# Patient Record
Sex: Female | Born: 1970 | Race: White | Hispanic: No | Marital: Married | Smoking: Never smoker
Health system: Southern US, Community
[De-identification: ages and names within clinical notes are randomized; demographics above are authoritative.]

## PROBLEM LIST (undated history)

## (undated) DIAGNOSIS — E669 Obesity, unspecified: Secondary | ICD-10-CM

## (undated) DIAGNOSIS — D649 Anemia, unspecified: Secondary | ICD-10-CM

## (undated) DIAGNOSIS — L4050 Arthropathic psoriasis, unspecified: Secondary | ICD-10-CM

## (undated) DIAGNOSIS — R519 Headache, unspecified: Secondary | ICD-10-CM

## (undated) HISTORY — PX: NO PAST SURGERIES: SHX2092

---

## 2005-07-18 ENCOUNTER — Inpatient Hospital Stay: Payer: Self-pay

## 2008-08-06 ENCOUNTER — Ambulatory Visit: Payer: Self-pay

## 2011-10-20 ENCOUNTER — Ambulatory Visit: Payer: Self-pay | Admitting: Internal Medicine

## 2013-09-26 ENCOUNTER — Ambulatory Visit: Payer: Self-pay | Admitting: Internal Medicine

## 2014-10-02 ENCOUNTER — Ambulatory Visit: Payer: Self-pay | Admitting: Internal Medicine

## 2015-08-24 ENCOUNTER — Other Ambulatory Visit: Payer: Self-pay | Admitting: Internal Medicine

## 2015-08-24 DIAGNOSIS — Z1231 Encounter for screening mammogram for malignant neoplasm of breast: Secondary | ICD-10-CM

## 2015-10-05 ENCOUNTER — Ambulatory Visit
Admission: RE | Admit: 2015-10-05 | Discharge: 2015-10-05 | Disposition: A | Discharge status: Home / Self Care | Payer: 59 | Source: Ambulatory Visit | Attending: Internal Medicine | Admitting: Internal Medicine

## 2015-10-05 DIAGNOSIS — Z1231 Encounter for screening mammogram for malignant neoplasm of breast: Secondary | ICD-10-CM

## 2016-12-23 ENCOUNTER — Other Ambulatory Visit: Payer: Self-pay | Admitting: Internal Medicine

## 2016-12-23 DIAGNOSIS — Z1231 Encounter for screening mammogram for malignant neoplasm of breast: Secondary | ICD-10-CM

## 2017-01-12 DIAGNOSIS — L4059 Other psoriatic arthropathy: Secondary | ICD-10-CM | POA: Diagnosis not present

## 2017-01-12 DIAGNOSIS — L4050 Arthropathic psoriasis, unspecified: Secondary | ICD-10-CM | POA: Diagnosis not present

## 2017-01-23 ENCOUNTER — Encounter (HOSPITAL_COMMUNITY): Payer: Self-pay

## 2017-01-23 ENCOUNTER — Ambulatory Visit
Admission: RE | Admit: 2017-01-23 | Discharge: 2017-01-23 | Disposition: A | Discharge status: Home / Self Care | Payer: 59 | Source: Ambulatory Visit | Attending: Internal Medicine | Admitting: Internal Medicine

## 2017-01-23 DIAGNOSIS — Z1231 Encounter for screening mammogram for malignant neoplasm of breast: Secondary | ICD-10-CM

## 2017-03-15 DIAGNOSIS — Z0000 Encounter for general adult medical examination without abnormal findings: Secondary | ICD-10-CM | POA: Diagnosis not present

## 2017-03-22 DIAGNOSIS — R739 Hyperglycemia, unspecified: Secondary | ICD-10-CM | POA: Diagnosis not present

## 2017-03-22 DIAGNOSIS — D510 Vitamin B12 deficiency anemia due to intrinsic factor deficiency: Secondary | ICD-10-CM | POA: Diagnosis not present

## 2017-03-22 DIAGNOSIS — Z79899 Other long term (current) drug therapy: Secondary | ICD-10-CM | POA: Diagnosis not present

## 2017-04-05 DIAGNOSIS — E538 Deficiency of other specified B group vitamins: Secondary | ICD-10-CM | POA: Diagnosis not present

## 2017-04-20 DIAGNOSIS — E538 Deficiency of other specified B group vitamins: Secondary | ICD-10-CM | POA: Diagnosis not present

## 2017-05-22 DIAGNOSIS — D510 Vitamin B12 deficiency anemia due to intrinsic factor deficiency: Secondary | ICD-10-CM | POA: Diagnosis not present

## 2017-06-26 DIAGNOSIS — L4050 Arthropathic psoriasis, unspecified: Secondary | ICD-10-CM | POA: Diagnosis not present

## 2017-06-27 DIAGNOSIS — L4050 Arthropathic psoriasis, unspecified: Secondary | ICD-10-CM | POA: Diagnosis not present

## 2017-07-10 DIAGNOSIS — H2511 Age-related nuclear cataract, right eye: Secondary | ICD-10-CM | POA: Diagnosis not present

## 2017-07-24 DIAGNOSIS — D510 Vitamin B12 deficiency anemia due to intrinsic factor deficiency: Secondary | ICD-10-CM | POA: Diagnosis not present

## 2017-09-15 DIAGNOSIS — R739 Hyperglycemia, unspecified: Secondary | ICD-10-CM | POA: Diagnosis not present

## 2017-09-15 DIAGNOSIS — Z79899 Other long term (current) drug therapy: Secondary | ICD-10-CM | POA: Diagnosis not present

## 2017-09-15 DIAGNOSIS — D510 Vitamin B12 deficiency anemia due to intrinsic factor deficiency: Secondary | ICD-10-CM | POA: Diagnosis not present

## 2017-09-22 DIAGNOSIS — Z0000 Encounter for general adult medical examination without abnormal findings: Secondary | ICD-10-CM | POA: Diagnosis not present

## 2017-09-22 DIAGNOSIS — D510 Vitamin B12 deficiency anemia due to intrinsic factor deficiency: Secondary | ICD-10-CM | POA: Diagnosis not present

## 2017-09-27 DIAGNOSIS — L4050 Arthropathic psoriasis, unspecified: Secondary | ICD-10-CM | POA: Diagnosis not present

## 2017-10-12 DIAGNOSIS — L4050 Arthropathic psoriasis, unspecified: Secondary | ICD-10-CM | POA: Diagnosis not present

## 2018-02-01 DIAGNOSIS — L4050 Arthropathic psoriasis, unspecified: Secondary | ICD-10-CM | POA: Diagnosis not present

## 2018-02-01 DIAGNOSIS — L4059 Other psoriatic arthropathy: Secondary | ICD-10-CM | POA: Diagnosis not present

## 2018-03-29 ENCOUNTER — Other Ambulatory Visit: Payer: Self-pay | Admitting: Internal Medicine

## 2018-03-29 DIAGNOSIS — Z1231 Encounter for screening mammogram for malignant neoplasm of breast: Secondary | ICD-10-CM

## 2018-04-05 DIAGNOSIS — D510 Vitamin B12 deficiency anemia due to intrinsic factor deficiency: Secondary | ICD-10-CM | POA: Diagnosis not present

## 2018-04-05 DIAGNOSIS — Z0000 Encounter for general adult medical examination without abnormal findings: Secondary | ICD-10-CM | POA: Diagnosis not present

## 2018-04-19 ENCOUNTER — Ambulatory Visit
Admission: RE | Admit: 2018-04-19 | Discharge: 2018-04-19 | Disposition: A | Discharge status: Home / Self Care | Payer: 59 | Source: Ambulatory Visit | Attending: Internal Medicine | Admitting: Internal Medicine

## 2018-04-19 DIAGNOSIS — Z1231 Encounter for screening mammogram for malignant neoplasm of breast: Secondary | ICD-10-CM | POA: Insufficient documentation

## 2018-05-22 DIAGNOSIS — L4050 Arthropathic psoriasis, unspecified: Secondary | ICD-10-CM | POA: Diagnosis not present

## 2018-05-23 DIAGNOSIS — L4059 Other psoriatic arthropathy: Secondary | ICD-10-CM | POA: Diagnosis not present

## 2018-09-13 DIAGNOSIS — L4050 Arthropathic psoriasis, unspecified: Secondary | ICD-10-CM | POA: Diagnosis not present

## 2018-09-18 DIAGNOSIS — Z0000 Encounter for general adult medical examination without abnormal findings: Secondary | ICD-10-CM | POA: Diagnosis not present

## 2018-09-25 DIAGNOSIS — D510 Vitamin B12 deficiency anemia due to intrinsic factor deficiency: Secondary | ICD-10-CM | POA: Diagnosis not present

## 2018-09-25 DIAGNOSIS — Z23 Encounter for immunization: Secondary | ICD-10-CM | POA: Diagnosis not present

## 2018-09-25 DIAGNOSIS — Z0000 Encounter for general adult medical examination without abnormal findings: Secondary | ICD-10-CM | POA: Diagnosis not present

## 2019-06-04 ENCOUNTER — Other Ambulatory Visit: Payer: Self-pay | Admitting: Internal Medicine

## 2019-06-04 DIAGNOSIS — Z1231 Encounter for screening mammogram for malignant neoplasm of breast: Secondary | ICD-10-CM

## 2019-07-12 ENCOUNTER — Ambulatory Visit
Admission: RE | Admit: 2019-07-12 | Discharge: 2019-07-12 | Disposition: A | Discharge status: Home / Self Care | Payer: 59 | Source: Ambulatory Visit | Attending: Internal Medicine | Admitting: Internal Medicine

## 2019-07-12 DIAGNOSIS — Z1231 Encounter for screening mammogram for malignant neoplasm of breast: Secondary | ICD-10-CM | POA: Diagnosis present

## 2019-07-16 ENCOUNTER — Other Ambulatory Visit: Payer: Self-pay | Admitting: Internal Medicine

## 2019-07-16 DIAGNOSIS — N6489 Other specified disorders of breast: Secondary | ICD-10-CM

## 2019-07-16 DIAGNOSIS — R928 Other abnormal and inconclusive findings on diagnostic imaging of breast: Secondary | ICD-10-CM

## 2019-07-25 ENCOUNTER — Ambulatory Visit
Admission: RE | Admit: 2019-07-25 | Discharge: 2019-07-25 | Disposition: A | Discharge status: Home / Self Care | Payer: 59 | Source: Ambulatory Visit | Attending: Internal Medicine | Admitting: Internal Medicine

## 2019-07-25 ENCOUNTER — Other Ambulatory Visit: Payer: Self-pay

## 2019-07-25 DIAGNOSIS — N6489 Other specified disorders of breast: Secondary | ICD-10-CM

## 2019-07-25 DIAGNOSIS — R928 Other abnormal and inconclusive findings on diagnostic imaging of breast: Secondary | ICD-10-CM | POA: Insufficient documentation

## 2019-09-24 ENCOUNTER — Other Ambulatory Visit: Payer: Self-pay | Admitting: Internal Medicine

## 2019-09-24 DIAGNOSIS — N6002 Solitary cyst of left breast: Secondary | ICD-10-CM

## 2019-10-20 IMAGING — MG MM DIGITAL SCREENING BILAT W/ CAD
4 series · 4 of 4 positions shown · non-contrast
Comparison: Previous exam(s).

CLINICAL DATA: Screening.

EXAM:
DIGITAL SCREENING BILATERAL MAMMOGRAM WITH CAD

[L MLO]
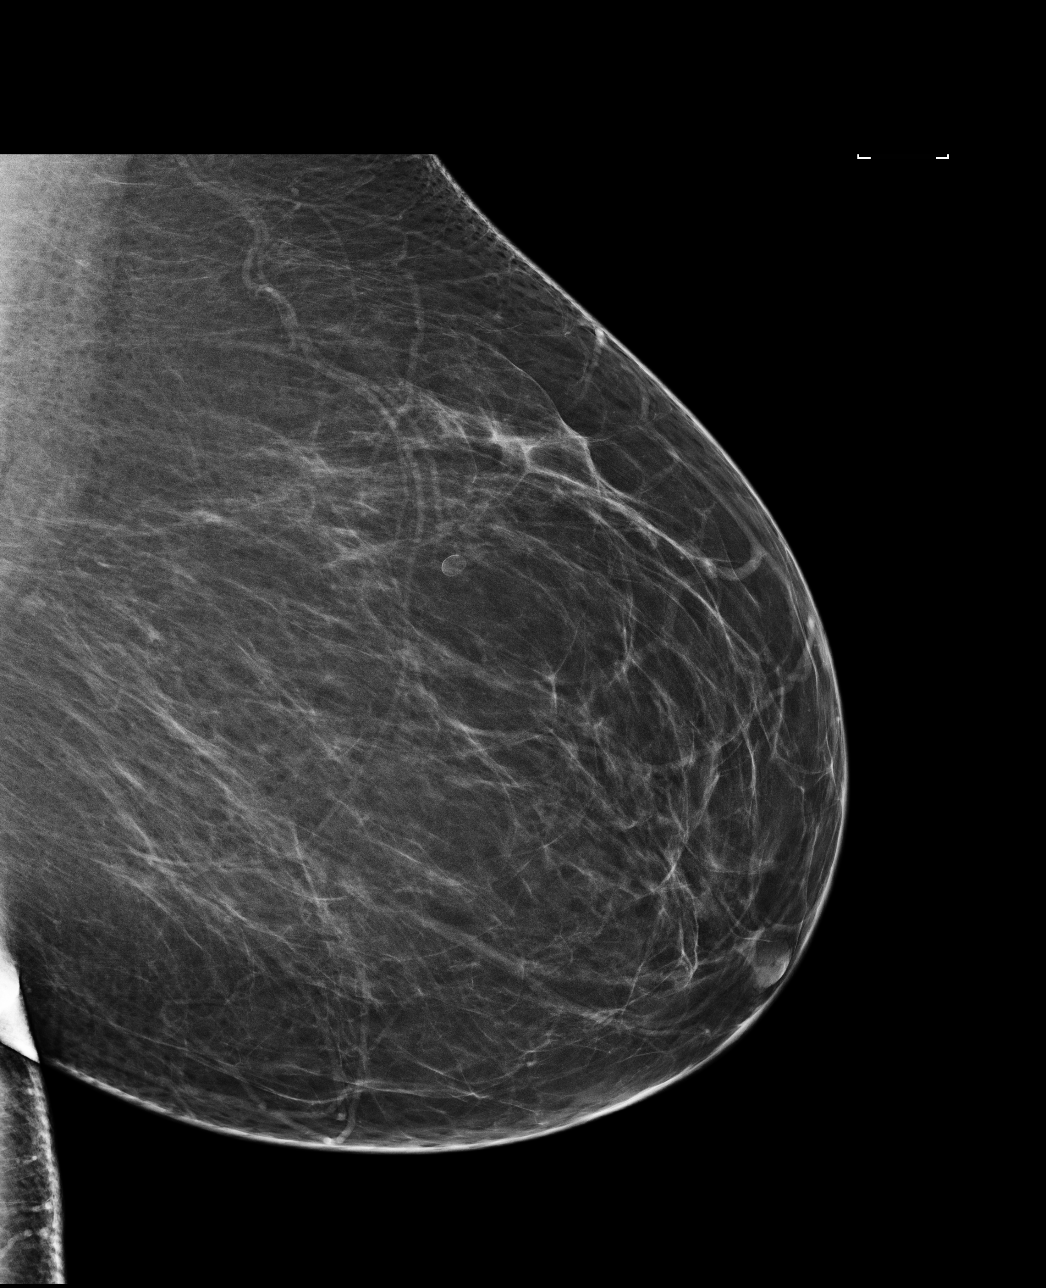

[L CC]
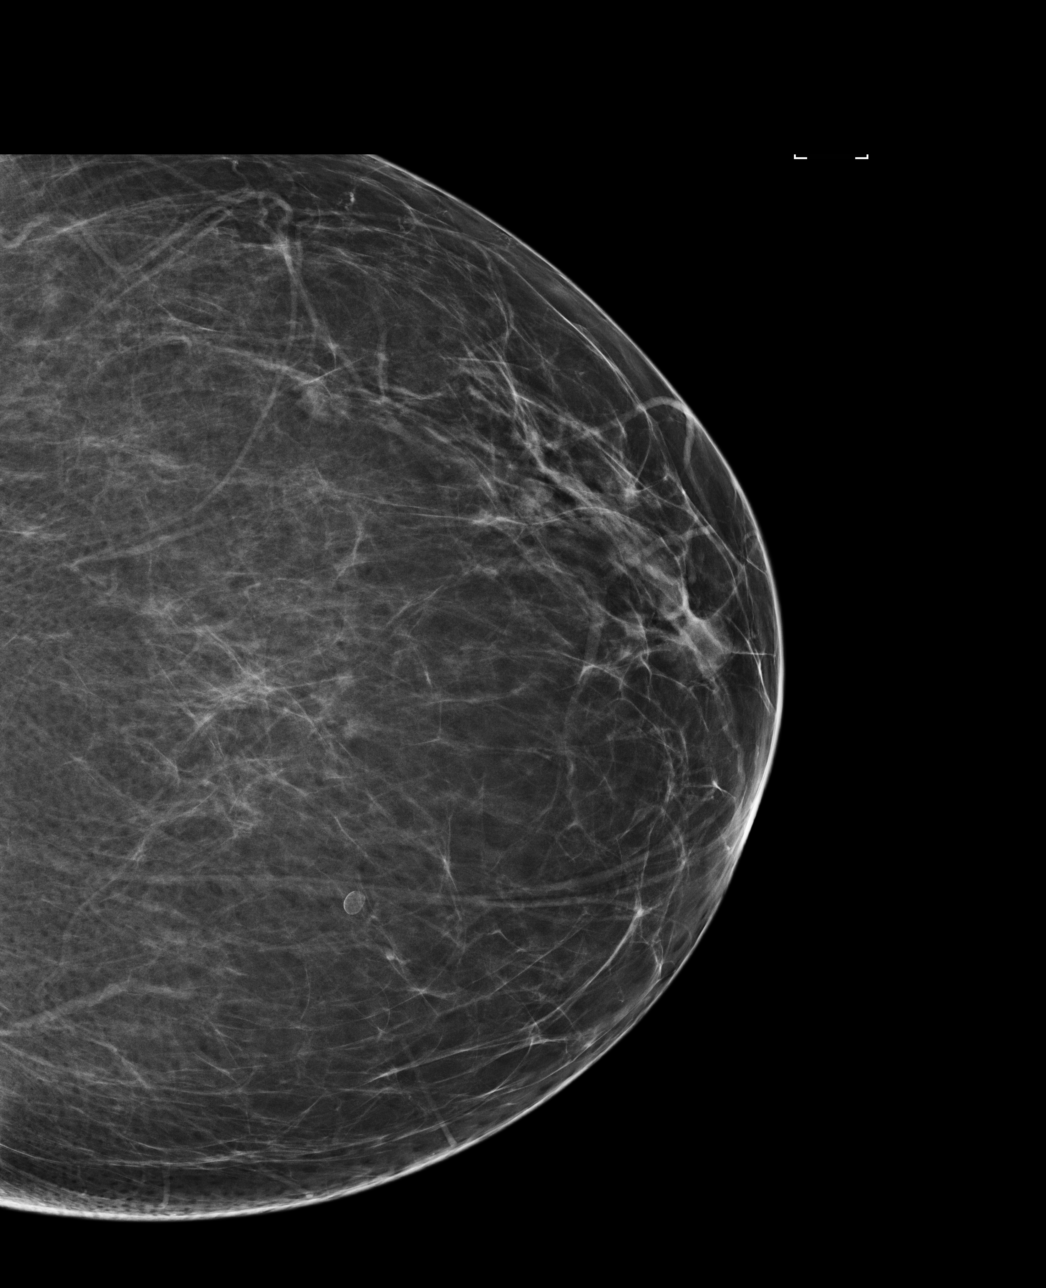

[R MLO]
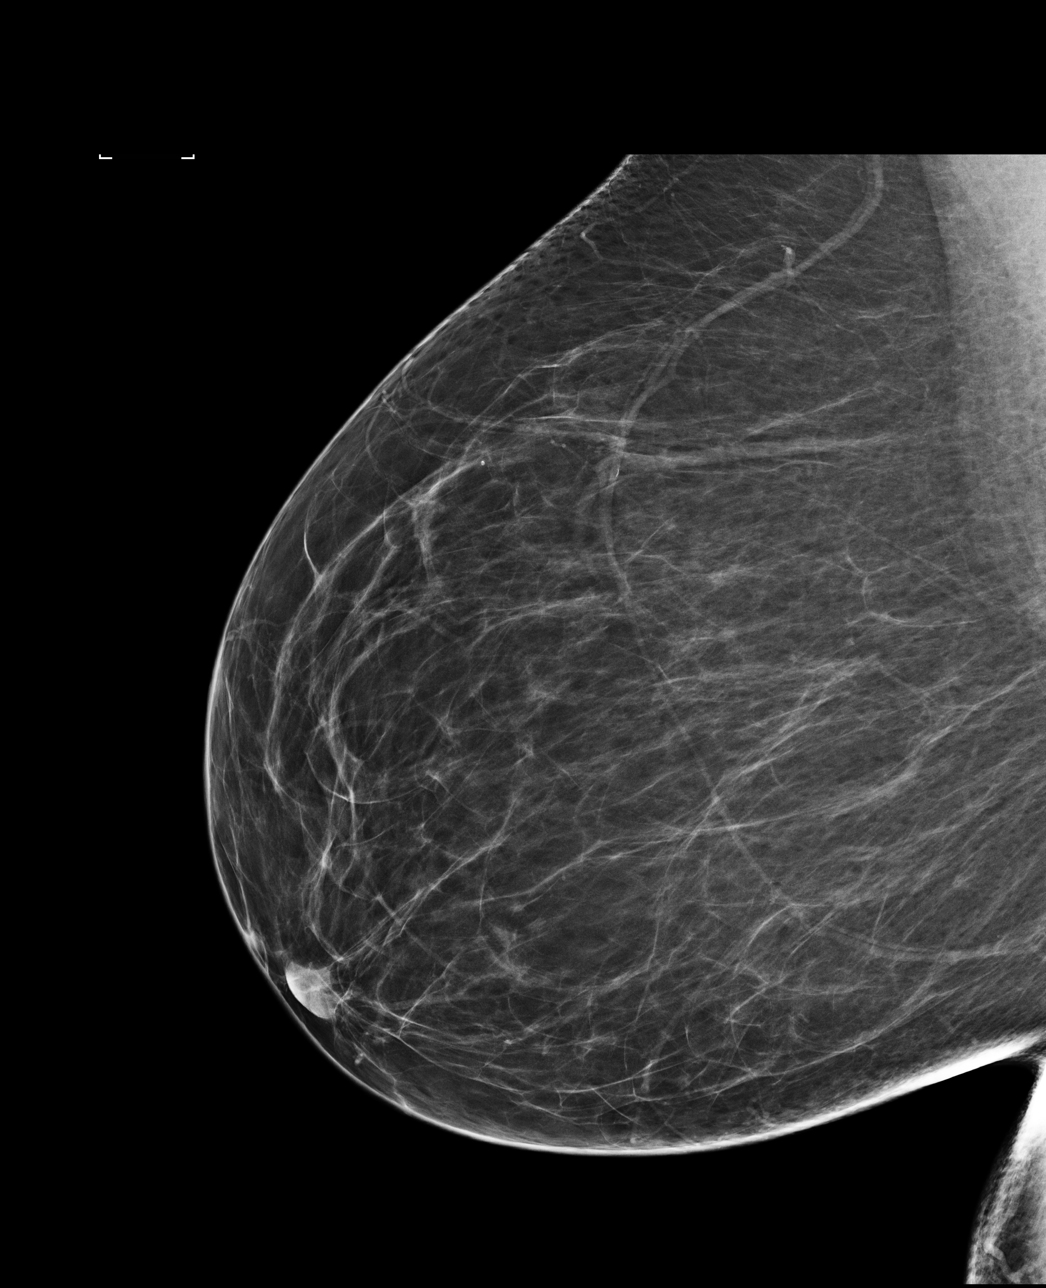

[R CC]
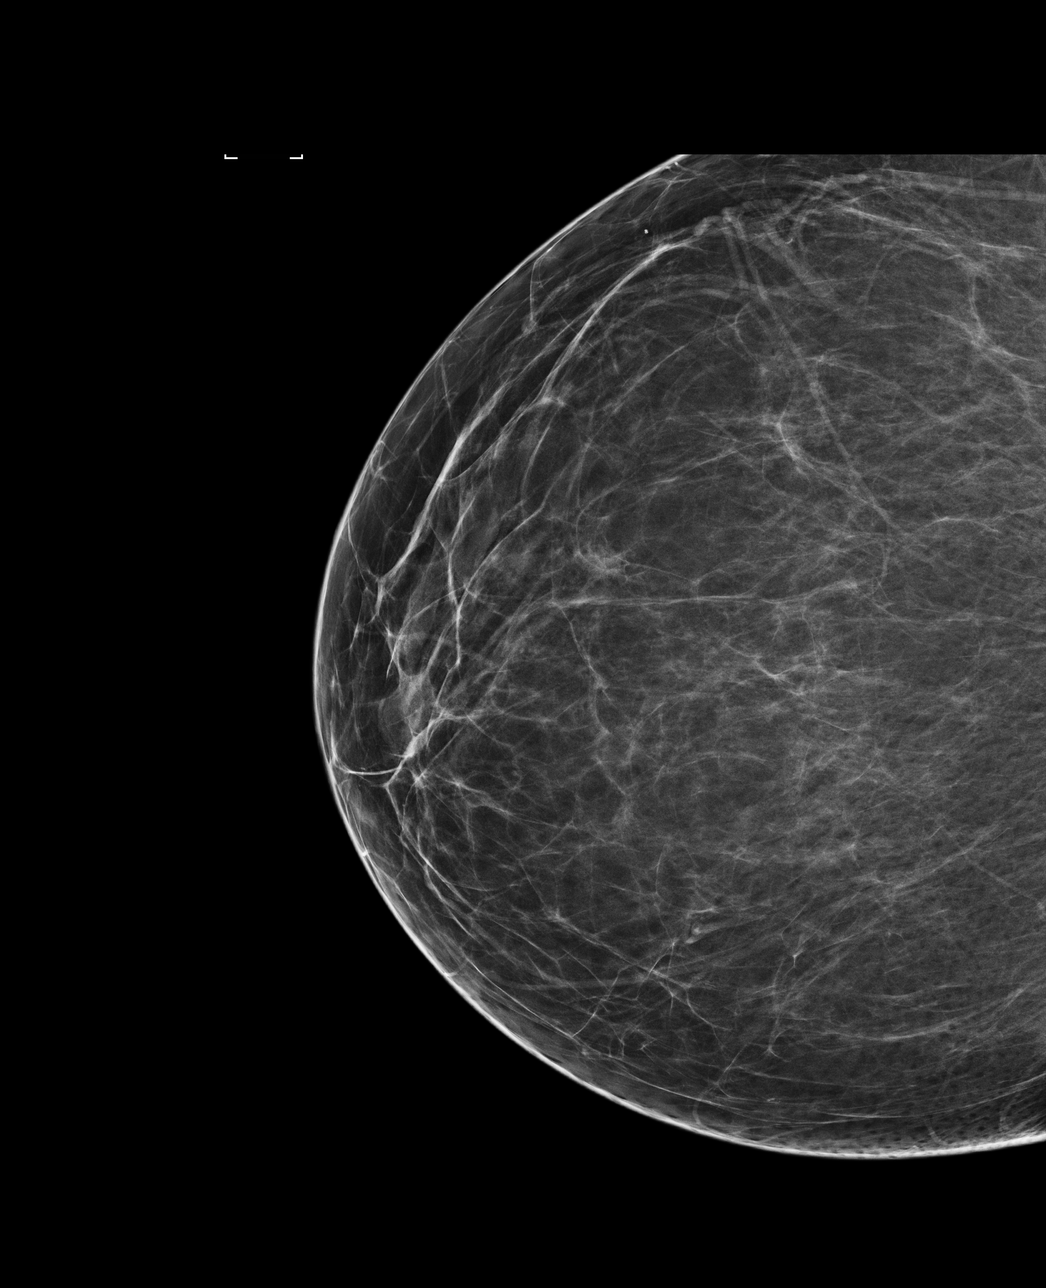

[4 of 4 positions shown; findings below may reference images not displayed]

ACR Breast Density Category b: There are scattered areas of
fibroglandular density.
FINDINGS: There are no findings suspicious for malignancy. Images were
processed with CAD.
IMPRESSION: No mammographic evidence of malignancy. A result letter of this
screening mammogram will be mailed directly to the patient.

RECOMMENDATION:
Screening mammogram in one year. (Code:AS-G-LCT)

BI-RADS CATEGORY  1: Negative.

## 2020-01-27 ENCOUNTER — Ambulatory Visit
Admission: RE | Admit: 2020-01-27 | Discharge: 2020-01-27 | Disposition: A | Discharge status: Home / Self Care | Payer: 59 | Source: Ambulatory Visit | Attending: Internal Medicine | Admitting: Internal Medicine

## 2020-01-27 DIAGNOSIS — N6002 Solitary cyst of left breast: Secondary | ICD-10-CM

## 2020-06-23 ENCOUNTER — Other Ambulatory Visit: Payer: Self-pay | Admitting: Internal Medicine

## 2020-06-23 DIAGNOSIS — N6320 Unspecified lump in the left breast, unspecified quadrant: Secondary | ICD-10-CM

## 2020-07-27 ENCOUNTER — Ambulatory Visit
Admission: RE | Admit: 2020-07-27 | Discharge: 2020-07-27 | Disposition: A | Discharge status: Home / Self Care | Payer: 59 | Source: Ambulatory Visit | Attending: Internal Medicine | Admitting: Internal Medicine

## 2020-07-27 ENCOUNTER — Other Ambulatory Visit: Payer: Self-pay

## 2020-07-27 DIAGNOSIS — N6320 Unspecified lump in the left breast, unspecified quadrant: Secondary | ICD-10-CM | POA: Diagnosis present

## 2021-07-01 ENCOUNTER — Other Ambulatory Visit: Payer: Self-pay | Admitting: Internal Medicine

## 2021-07-01 DIAGNOSIS — N6320 Unspecified lump in the left breast, unspecified quadrant: Secondary | ICD-10-CM

## 2021-07-28 ENCOUNTER — Ambulatory Visit
Admission: RE | Admit: 2021-07-28 | Discharge: 2021-07-28 | Disposition: A | Discharge status: Home / Self Care | Payer: 59 | Source: Ambulatory Visit | Attending: Internal Medicine | Admitting: Internal Medicine

## 2021-07-28 ENCOUNTER — Other Ambulatory Visit: Payer: Self-pay

## 2021-07-28 DIAGNOSIS — N6320 Unspecified lump in the left breast, unspecified quadrant: Secondary | ICD-10-CM

## 2022-07-08 ENCOUNTER — Ambulatory Visit: Payer: 59 | Admitting: Anesthesiology

## 2022-07-08 ENCOUNTER — Encounter: Payer: Self-pay | Admitting: *Deleted

## 2022-07-08 ENCOUNTER — Other Ambulatory Visit: Payer: Self-pay

## 2022-07-08 ENCOUNTER — Ambulatory Visit
Admission: RE | Admit: 2022-07-08 | Discharge: 2022-07-08 | Disposition: A | Discharge status: Home / Self Care | Payer: 59 | Source: Ambulatory Visit | Attending: Gastroenterology | Admitting: Gastroenterology

## 2022-07-08 ENCOUNTER — Encounter
Admission: RE | Disposition: A | Discharge status: Home / Self Care | Payer: Self-pay | Source: Ambulatory Visit | Attending: Gastroenterology

## 2022-07-08 DIAGNOSIS — K635 Polyp of colon: Secondary | ICD-10-CM | POA: Diagnosis not present

## 2022-07-08 DIAGNOSIS — Z1211 Encounter for screening for malignant neoplasm of colon: Secondary | ICD-10-CM | POA: Diagnosis present

## 2022-07-08 DIAGNOSIS — L4050 Arthropathic psoriasis, unspecified: Secondary | ICD-10-CM | POA: Diagnosis not present

## 2022-07-08 DIAGNOSIS — Z79899 Other long term (current) drug therapy: Secondary | ICD-10-CM | POA: Insufficient documentation

## 2022-07-08 DIAGNOSIS — Z6841 Body Mass Index (BMI) 40.0 and over, adult: Secondary | ICD-10-CM | POA: Insufficient documentation

## 2022-07-08 HISTORY — DX: Arthropathic psoriasis, unspecified: L40.50

## 2022-07-08 HISTORY — DX: Obesity, unspecified: E66.9

## 2022-07-08 HISTORY — PX: COLONOSCOPY WITH PROPOFOL: SHX5780

## 2022-07-08 HISTORY — DX: Anemia, unspecified: D64.9

## 2022-07-08 HISTORY — DX: Headache, unspecified: R51.9

## 2022-07-08 LAB — POCT PREGNANCY, URINE: Preg Test, Ur: NEGATIVE

## 2022-07-08 SURGERY — COLONOSCOPY WITH PROPOFOL
Anesthesia: General

## 2022-07-08 MED ORDER — SUCCINYLCHOLINE CHLORIDE 200 MG/10ML IV SOSY
PREFILLED_SYRINGE | INTRAVENOUS | Status: DC | PRN
  Administered 2022-07-08: 100 mg via INTRAVENOUS

## 2022-07-08 MED ORDER — FENTANYL CITRATE (PF) 100 MCG/2ML IJ SOLN
INTRAMUSCULAR | Status: AC
  Filled 2022-07-08: qty 2

## 2022-07-08 MED ORDER — SEVOFLURANE IN SOLN
RESPIRATORY_TRACT | Status: AC
  Filled 2022-07-08: qty 250

## 2022-07-08 MED ORDER — ONDANSETRON HCL 4 MG/2ML IJ SOLN
INTRAMUSCULAR | Status: DC | PRN
  Administered 2022-07-08: 4 mg via INTRAVENOUS

## 2022-07-08 MED ORDER — LIDOCAINE HCL (CARDIAC) PF 100 MG/5ML IV SOSY
PREFILLED_SYRINGE | INTRAVENOUS | Status: DC | PRN
  Administered 2022-07-08: 100 mg via INTRAVENOUS

## 2022-07-08 MED ORDER — SODIUM CHLORIDE 0.9 % IV SOLN: INTRAVENOUS | Status: DC

## 2022-07-08 MED ORDER — FENTANYL CITRATE (PF) 100 MCG/2ML IJ SOLN
INTRAMUSCULAR | Status: DC | PRN
  Administered 2022-07-08 (×2): 50 ug via INTRAVENOUS

## 2022-07-08 MED ORDER — PROPOFOL 10 MG/ML IV BOLUS
INTRAVENOUS | Status: DC | PRN
  Administered 2022-07-08: 150 mg via INTRAVENOUS
  Administered 2022-07-08: 30 mg via INTRAVENOUS

## 2022-07-08 NOTE — Interval H&P Note (Signed)
History and Physical Interval Note:  07/08/2022 10:41 AM  Tonya Cantu  has presented today for surgery, with the diagnosis of Screening Colonoscopy.  The various methods of treatment have been discussed with the patient and family. After consideration of risks, benefits and other options for treatment, the patient has consented to  Procedure(s): COLONOSCOPY WITH PROPOFOL (N/A) as a surgical intervention.  The patient's history has been reviewed, patient examined, no change in status, stable for surgery.  I have reviewed the patient's chart and labs.  Questions were answered to the patient's satisfaction.     Regis Bill  Ok to proceed with colonoscopy

## 2022-07-08 NOTE — Anesthesia Procedure Notes (Signed)
Procedure Name: Intubation Date/Time: 07/08/2022 10:53 AM  Performed by: Ginger Carne, CRNAPre-anesthesia Checklist: Patient identified, Emergency Drugs available, Suction available, Patient being monitored and Timeout performed Patient Re-evaluated:Patient Re-evaluated prior to induction Oxygen Delivery Method: Circle system utilized Preoxygenation: Pre-oxygenation with 100% oxygen Induction Type: IV induction Ventilation: Mask ventilation without difficulty and Two handed mask ventilation required Laryngoscope Size: McGraph and 3 Grade View: Grade I Tube type: Oral Tube size: 7.0 mm Number of attempts: 1 Airway Equipment and Method: Stylet and Video-laryngoscopy Placement Confirmation: ETT inserted through vocal cords under direct vision, positive ETCO2 and breath sounds checked- equal and bilateral Secured at: 21 cm Tube secured with: Tape Dental Injury: Teeth and Oropharynx as per pre-operative assessment  Difficulty Due To: Difficulty was anticipated, Difficult Airway- due to reduced neck mobility and Difficult Airway- due to limited oral opening Future Recommendations: Recommend- induction with short-acting agent, and alternative techniques readily available

## 2022-07-08 NOTE — Anesthesia Postprocedure Evaluation (Signed)
Anesthesia Post Note  Patient: Tonya Cantu  Procedure(s) Performed: COLONOSCOPY WITH PROPOFOL  Patient location during evaluation: PACU Anesthesia Type: General Level of consciousness: awake and alert Pain management: satisfactory to patient Vital Signs Assessment: post-procedure vital signs reviewed and stable Respiratory status: spontaneous breathing and respiratory function stable Cardiovascular status: stable Anesthetic complications: yes   Encounter Notable Events  Notable Event Outcome Phase Comment  Difficult to intubate - expected  Intraprocedure Filed from anesthesia note documentation.     Last Vitals:  Vitals:   07/08/22 1143 07/08/22 1153  BP: 117/70 119/61  Pulse: (!) 102 95  Resp: (!) 21 19  Temp:    SpO2: 99% 99%    Last Pain:  Vitals:   07/08/22 1143  TempSrc:   PainSc: 0-No pain                 VAN STAVEREN,Eldwin Volkov

## 2022-07-08 NOTE — Op Note (Signed)
[facility]astroenterology Patient Name: Tonya Cantu Procedure Date: 07/08/2022 10:45 AM MRN: [id number] Account #: [id number] Date of Birth: 04-03-1971 Admit Type: Outpatient Age: 51 Room: [facility] ENDO ROOM 3 Gender: Female Note Status: Finalized Instrument Name: Jasper Riling [id number] Procedure:             Colonoscopy Indications:           Screening for colorectal malignant neoplasm Providers:             Andrey Farmer MD, MD Referring MD:          Rusty Aus, MD (Referring MD) Medicines:             Monitored Anesthesia Care Complications:         No immediate complications. Procedure:             Pre-Anesthesia Assessment:                        - Prior to the procedure, a History and Physical was                         performed, and patient medications and allergies were                         reviewed. The patient is competent. The risks and                         benefits of the procedure and the sedation options and                         risks were discussed with the patient. All questions                         were answered and informed consent was obtained.                         Patient identification and proposed procedure were                         verified by the physician, the nurse, the                         anesthesiologist, the anesthetist and the technician                         in the endoscopy suite. Mental Status Examination:                         alert and oriented. Airway Examination: normal                         oropharyngeal airway and neck mobility. Respiratory                         Examination: clear to auscultation. CV Examination:                         normal. Prophylactic Antibiotics: The patient does not  require prophylactic antibiotics. Prior                         Anticoagulants: The patient has taken no previous                         anticoagulant or antiplatelet agents.  ASA Grade                         Assessment: III - A patient with severe systemic                         disease. After reviewing the risks and benefits, the                         patient was deemed in satisfactory condition to                         undergo the procedure. The anesthesia plan was to use                         monitored anesthesia care (MAC). Immediately prior to                         administration of medications, the patient was                         re-assessed for adequacy to receive sedatives. The                         heart rate, respiratory rate, oxygen saturations,                         blood pressure, adequacy of pulmonary ventilation, and                         response to care were monitored throughout the                         procedure. The physical status of the patient was                         re-assessed after the procedure.                        After obtaining informed consent, the colonoscope was                         passed under direct vision. Throughout the procedure,                         the patient's blood pressure, pulse, and oxygen                         saturations were monitored continuously. The                         Colonoscope was introduced through the anus and  advanced to the the cecum, identified by appendiceal                         orifice and ileocecal valve. The colonoscopy was                         somewhat difficult due to significant looping.                         Successful completion of the procedure was aided by                         using scope torsion. The patient tolerated the                         procedure well. The quality of the bowel preparation                         was good. Findings:      The perianal and digital rectal examinations were normal.      A 4 mm polyp was found in the sigmoid colon. The polyp was sessile. The       polyp was removed with a cold  snare. Resection and retrieval were       complete. Estimated blood loss was minimal.      The exam was otherwise without abnormality on direct and retroflexion       views. Impression:            - One 4 mm polyp in the sigmoid colon, removed with a                         cold snare. Resected and retrieved.                        - The examination was otherwise normal on direct and                         retroflexion views. Recommendation:        - Discharge patient to home.                        - Resume previous diet.                        - Continue present medications.                        - Await pathology results.                        - Repeat colonoscopy date to be determined after                         pending pathology results are reviewed for                         surveillance.                        - Return to referring physician as previously  scheduled. Procedure Code(s):     --- Professional ---                        [phone], Colonoscopy, flexible; with removal of                         tumor(s), polyp(s), or other lesion(s) by snare                         technique Diagnosis Code(s):     --- Professional ---                        Z12.11, Encounter for screening for malignant neoplasm                         of colon                        K63.5, Polyp of colon CPT copyright 2019 [employer]. All rights reserved. The codes documented in this report are preliminary and upon coder review may  be revised to meet current compliance requirements. Andrey Farmer MD, MD 07/08/2022 11:30:09 AM Number of Addenda: 0 Note Initiated On: 07/08/2022 10:45 AM Scope Withdrawal Time: 0 hours 10 minutes 31 seconds  Total Procedure Duration: 0 hours 26 minutes 37 seconds  Estimated Blood Loss:  Estimated blood loss: none.      [facility]

## 2022-07-08 NOTE — Anesthesia Preprocedure Evaluation (Signed)
Anesthesia Evaluation  Patient identified by MRN, date of birth, ID band Patient awake    Reviewed: Allergy & Precautions, NPO status , Patient's Chart, lab work & pertinent test results  Airway Mallampati: III  TM Distance: >3 FB Neck ROM: Full    Dental  (+) Teeth Intact   Pulmonary neg pulmonary ROS,    Pulmonary exam normal  + decreased breath sounds      Cardiovascular Exercise Tolerance: Good negative cardio ROS Normal cardiovascular exam Rhythm:Regular     Neuro/Psych negative neurological ROS  negative psych ROS   GI/Hepatic negative GI ROS, Neg liver ROS,   Endo/Other  negative endocrine ROSMorbid obesity  Renal/GU negative Renal ROS  negative genitourinary   Musculoskeletal   Abdominal (+) + obese,   Peds negative pediatric ROS (+)  Hematology negative hematology ROS (+) Blood dyscrasia, anemia ,   Anesthesia Other Findings Past Medical History: No date: Anemia No date: Headache No date: Obesity No date: Psoriatic arthritis (HCC)  Past Surgical History: No date: NO PAST SURGERIES  BMI    Body Mass Index: 48.23 kg/m      Reproductive/Obstetrics negative OB ROS                             Anesthesia Physical Anesthesia Plan  ASA: 3  Anesthesia Plan: General   Post-op Pain Management:    Induction: Intravenous  PONV Risk Score and Plan: Ondansetron, Dexamethasone, Midazolam and Treatment may vary due to age or medical condition  Airway Management Planned: Oral ETT  Additional Equipment:   Intra-op Plan:   Post-operative Plan: Extubation in OR  Informed Consent: I have reviewed the patients History and Physical, chart, labs and discussed the procedure including the risks, benefits and alternatives for the proposed anesthesia with the patient or authorized representative who has indicated his/her understanding and acceptance.     Dental Advisory  Given  Plan Discussed with: CRNA and Surgeon  Anesthesia Plan Comments:         Anesthesia Quick Evaluation

## 2022-07-08 NOTE — Transfer of Care (Signed)
Immediate Anesthesia Transfer of Care Note  Patient: Tonya Cantu  Procedure(s) Performed: COLONOSCOPY WITH PROPOFOL  Patient Location: PACU  Anesthesia Type:General  Level of Consciousness: awake, alert  and drowsy  Airway & Oxygen Therapy: Patient Spontanous Breathing and Patient connected to face mask oxygen  Post-op Assessment: Report given to RN and Post -op Vital signs reviewed and stable  Post vital signs: Reviewed and stable  Last Vitals:  Vitals Value Taken Time  BP 109/93 07/08/22 1136  Temp 35.6 C 07/08/22 1132  Pulse 103 07/08/22 1136  Resp 17 07/08/22 1136  SpO2 100 % 07/08/22 1136    Last Pain:  Vitals:   07/08/22 1132  TempSrc: Temporal  PainSc: Asleep         Complications:  Encounter Notable Events  Notable Event Outcome Phase Comment  Difficult to intubate - expected  Intraprocedure Filed from anesthesia note documentation.

## 2022-07-08 NOTE — H&P (Signed)
Outpatient short stay form Pre-procedure 07/08/2022  Regis Bill, MD  Primary Physician: Danella Penton, MD  Reason for visit:  Screening  History of present illness:    51 y/o lady with history of obesity on ozempic here for screening colonoscopy. No blood thinners. No family history of GI malignancies. No significant abdominal surgeries.    Current Facility-Administered Medications:    0.9 %  sodium chloride infusion, , Intravenous, Continuous, Ednah Hammock, Rossie Muskrat, MD, Last Rate: 20 mL/hr at 07/08/22 5102, Restarted at 07/08/22 1036  Medications Prior to Admission  Medication Sig Dispense Refill Last Dose   Cyanocobalamin (VITAMIN B-12) 2500 MCG SUBL Place 1 tablet under the tongue daily.   07/07/2022   DULoxetine (CYMBALTA) 30 MG capsule Take 30 mg by mouth daily.   07/07/2022   folic acid (FOLVITE) 1 MG tablet Take 1 mg by mouth daily.   07/07/2022   Levonorgestrel-Ethinyl Estradiol (SIMPESSE) 0.15-0.03 &0.01 MG tablet Take 1 tablet by mouth daily.   07/07/2022   methotrexate (RHEUMATREX) 2.5 MG tablet Take 2.5 mg by mouth once a week. Caution:Chemotherapy. Protect from light.   07/04/2022   Secukinumab 150 MG/ML SOAJ Inject 150 mg into the skin every 14 (fourteen) days.   07/04/2022   Semaglutide,0.25 or 0.5MG /DOS, (OZEMPIC, 0.25 OR 0.5 MG/DOSE,) 2 MG/1.5ML SOPN Inject 0.75 mLs into the skin once a week.   07/04/2022   triamcinolone cream (KENALOG) 0.5 % Apply 1 Application topically 3 (three) times daily.        Allergies  Allergen Reactions   Fish Oil    Sulfa Antibiotics Hives   Etodolac Rash     Past Medical History:  Diagnosis Date   Anemia    Headache    Obesity    Psoriatic arthritis (HCC)     Review of systems:  Otherwise negative.    Physical Exam  Gen: Alert, oriented. Appears stated age.  HEENT: PERRLA. Lungs: No respiratory distress CV: RRR Abd: soft, benign, no masses Ext: No edema    Planned procedures: Proceed with colonoscopy. The patient  understands the nature of the planned procedure, indications, risks, alternatives and potential complications including but not limited to bleeding, infection, perforation, damage to internal organs and possible oversedation/side effects from anesthesia. The patient agrees and gives consent to proceed.  Please refer to procedure notes for findings, recommendations and patient disposition/instructions.     Regis Bill, MD [facility]

## 2022-07-11 ENCOUNTER — Encounter: Payer: Self-pay | Admitting: Gastroenterology

## 2022-07-11 LAB — SURGICAL PATHOLOGY

## 2022-07-14 ENCOUNTER — Other Ambulatory Visit: Payer: Self-pay | Admitting: Internal Medicine

## 2022-07-14 DIAGNOSIS — Z1231 Encounter for screening mammogram for malignant neoplasm of breast: Secondary | ICD-10-CM

## 2022-08-11 ENCOUNTER — Ambulatory Visit
Admission: RE | Admit: 2022-08-11 | Discharge: 2022-08-11 | Disposition: A | Discharge status: Home / Self Care | Payer: 59 | Source: Ambulatory Visit | Attending: Internal Medicine | Admitting: Internal Medicine

## 2022-08-11 DIAGNOSIS — Z1231 Encounter for screening mammogram for malignant neoplasm of breast: Secondary | ICD-10-CM | POA: Diagnosis not present

## 2023-01-28 IMAGING — MG DIGITAL DIAGNOSTIC BILAT W/ TOMO W/ CAD
6 of 12 series · 6 of 36 positions shown · non-contrast
Comparison: Previous exam(s).

CLINICAL DATA: BI-RADS 3 follow-up of a LEFT breast mass, initiated
July 2019.

EXAM:
DIGITAL DIAGNOSTIC BILATERAL MAMMOGRAM WITH TOMOSYNTHESIS AND CAD;
ULTRASOUND LEFT BREAST LIMITED
TECHNIQUE: Bilateral digital diagnostic mammography and breast tomosynthesis
was performed. The images were evaluated with computer-aided
detection.; Targeted ultrasound examination of the left breast was
performed.

[L CC synth-2D]
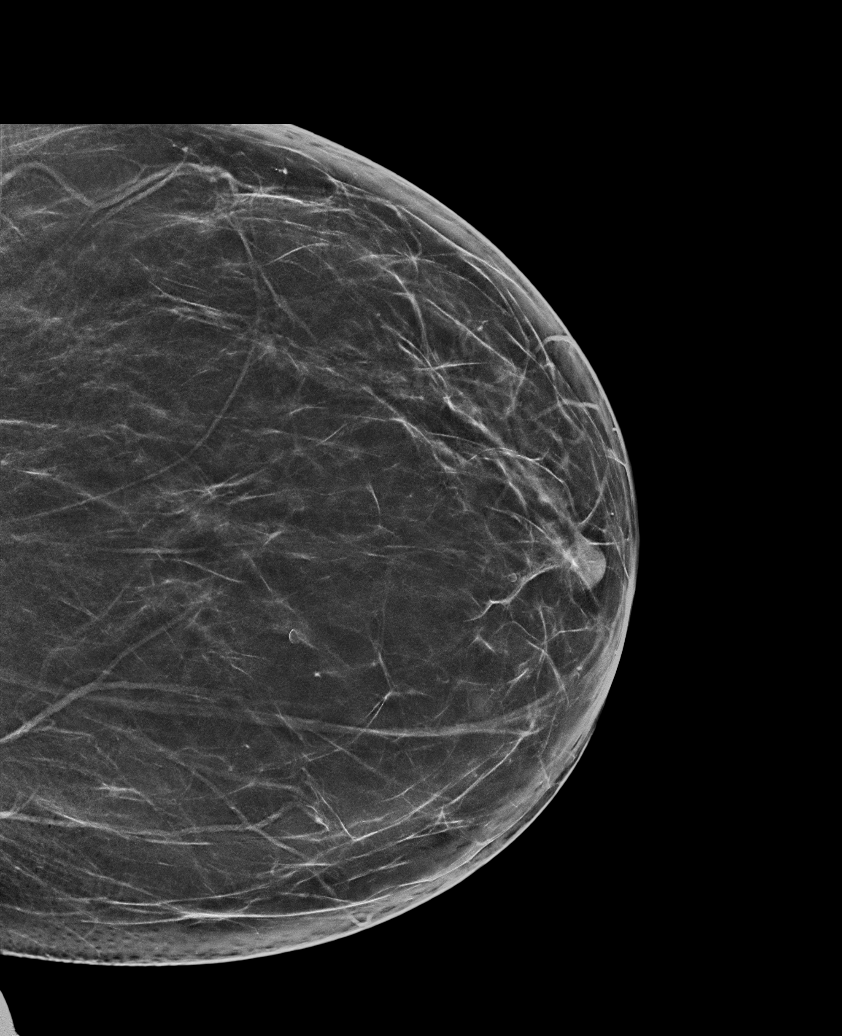

[L CV synth-2D]
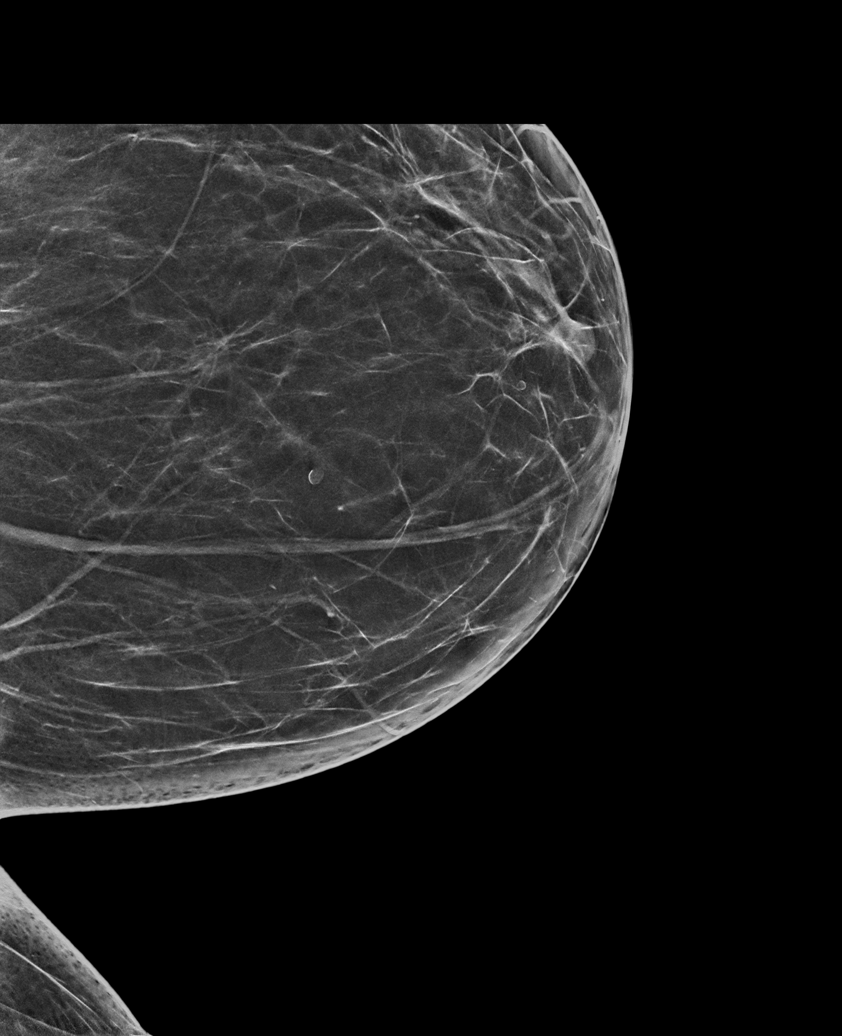

[L MLO synth-2D]
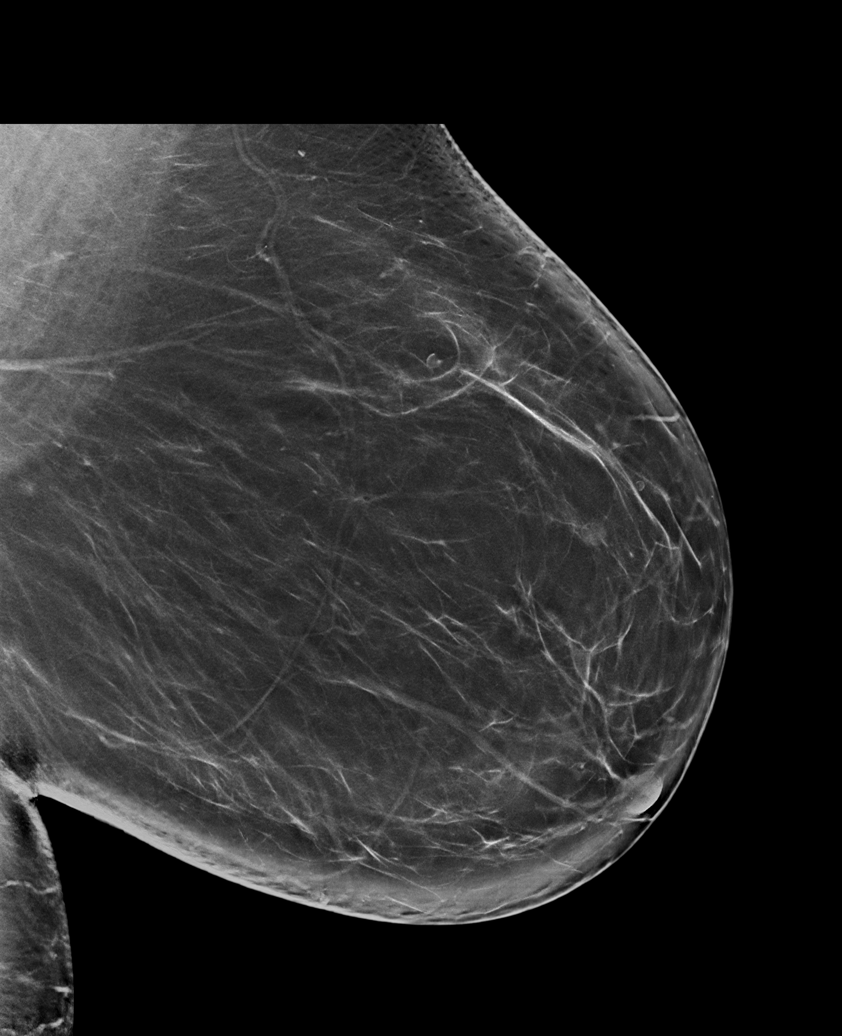

[R MLO synth-2D]
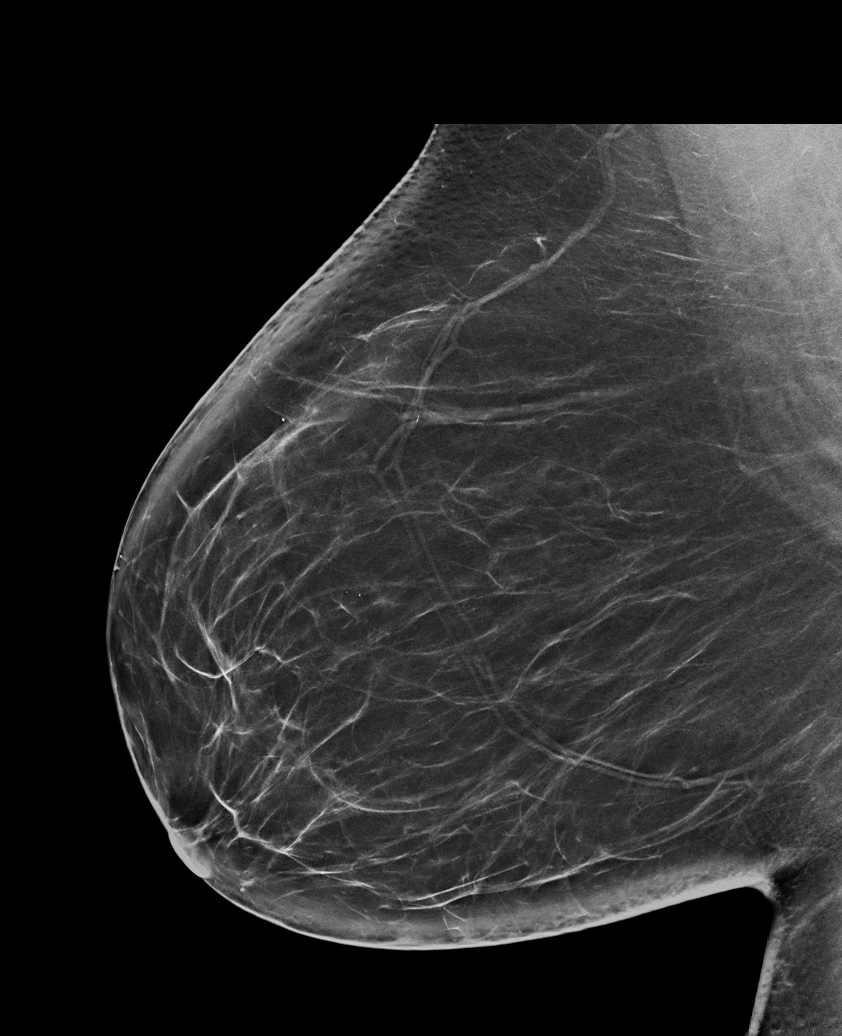

[R CV synth-2D]
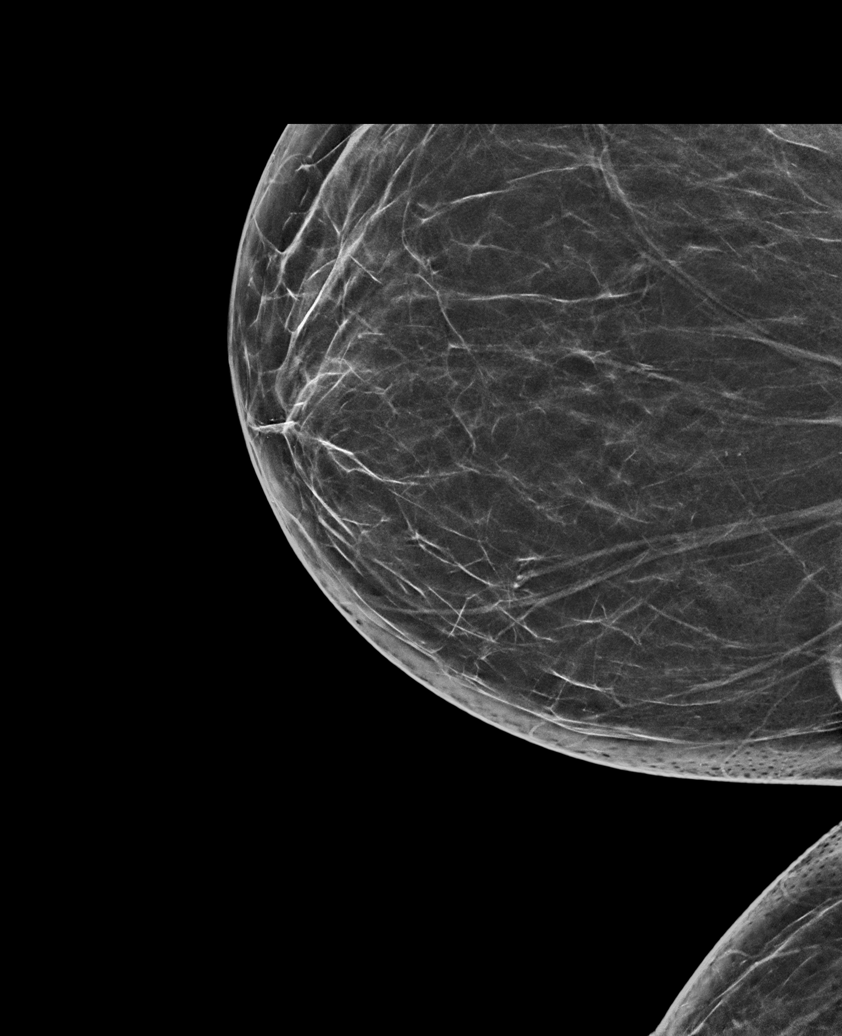

[R CC synth-2D]
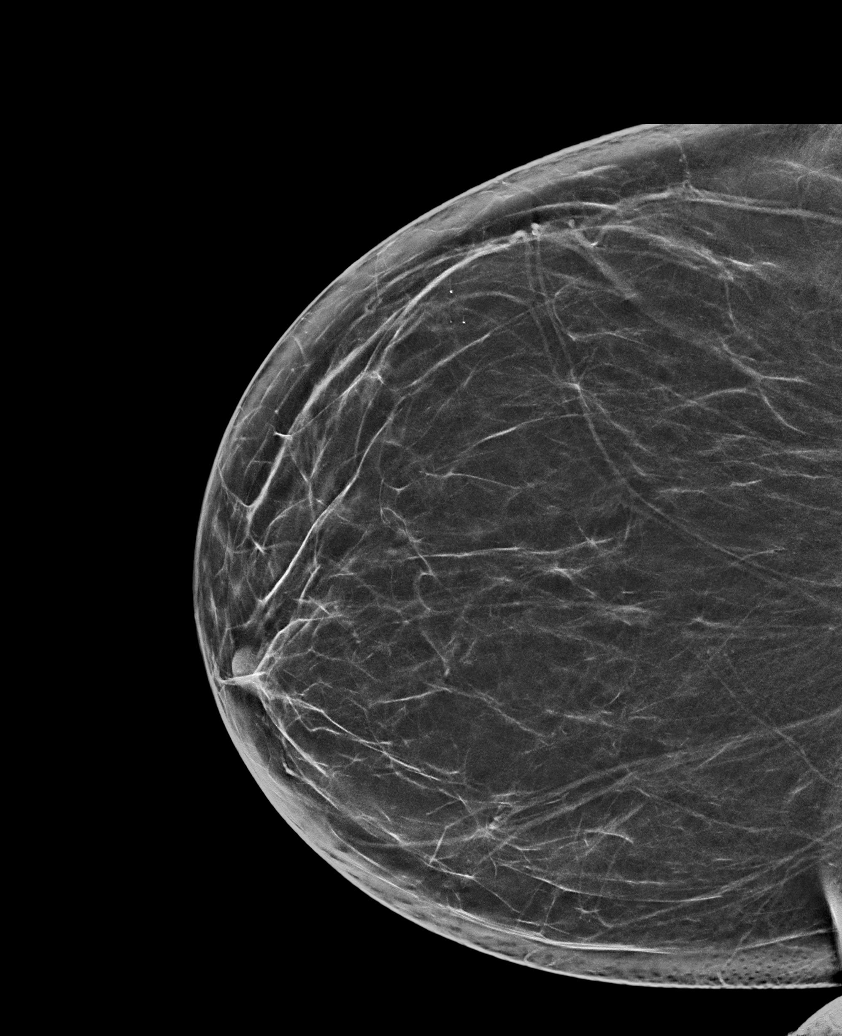

[6 of 36 positions shown; findings below may reference images not displayed]

ACR Breast Density Category b: There are scattered areas of
fibroglandular density.
FINDINGS: Diagnostic images demonstrate mammographic stability of a mass in
the LEFT outer breast posterior depth. No new suspicious findings
within the LEFT breast. No suspicious mass, distortion, or
microcalcifications are identified to suggest presence of malignancy
bilaterally.

Targeted ultrasound was performed of the LEFT outer breast. There is
revisualization of an oval circumscribed near anechoic mass at [DATE]
7 cm from the nipple. It measures 6 x 6 x 3 mm, previously 6 x 6 x 3
mm.
IMPRESSION: 1. There is a stable mass in the LEFT outer breast for greater than
2 years, consistent with a benign etiology. Recommend return to
annual screening mammography.
2. No mammographic evidence of malignancy bilaterally

RECOMMENDATION:
Screening mammogram in one year.(Code:[id number])

I have discussed the findings and recommendations with the patient.
If applicable, a reminder letter will be sent to the patient
regarding the next appointment.

BI-RADS CATEGORY  2: Benign.

## 2023-02-27 ENCOUNTER — Other Ambulatory Visit: Payer: Self-pay | Admitting: Unknown Physician Specialty

## 2023-02-27 DIAGNOSIS — R221 Localized swelling, mass and lump, neck: Secondary | ICD-10-CM

## 2023-03-03 ENCOUNTER — Ambulatory Visit
Admission: RE | Admit: 2023-03-03 | Discharge: 2023-03-03 | Disposition: A | Discharge status: Home / Self Care | Payer: BC Managed Care – PPO | Source: Ambulatory Visit | Attending: Unknown Physician Specialty | Admitting: Unknown Physician Specialty

## 2023-03-03 DIAGNOSIS — R221 Localized swelling, mass and lump, neck: Secondary | ICD-10-CM

## 2023-03-03 MED ORDER — IOPAMIDOL (ISOVUE-300) INJECTION 61%
75.0000 mL | Freq: Once | INTRAVENOUS | Status: AC | PRN
  Administered 2023-03-03: 75 mL via INTRAVENOUS

## 2023-08-16 ENCOUNTER — Other Ambulatory Visit: Payer: Self-pay | Admitting: Internal Medicine

## 2023-08-16 DIAGNOSIS — Z1231 Encounter for screening mammogram for malignant neoplasm of breast: Secondary | ICD-10-CM

## 2023-08-22 ENCOUNTER — Ambulatory Visit
Admission: RE | Admit: 2023-08-22 | Discharge: 2023-08-22 | Disposition: A | Discharge status: Home / Self Care | Payer: BC Managed Care – PPO | Source: Ambulatory Visit | Attending: Internal Medicine | Admitting: Internal Medicine

## 2023-08-22 DIAGNOSIS — Z1231 Encounter for screening mammogram for malignant neoplasm of breast: Secondary | ICD-10-CM | POA: Diagnosis present

## 2024-08-15 ENCOUNTER — Other Ambulatory Visit: Payer: Self-pay | Admitting: Internal Medicine

## 2024-08-15 DIAGNOSIS — Z1231 Encounter for screening mammogram for malignant neoplasm of breast: Secondary | ICD-10-CM

## 2024-09-10 ENCOUNTER — Ambulatory Visit
Admission: RE | Admit: 2024-09-10 | Discharge: 2024-09-10 | Disposition: A | Discharge status: Home / Self Care | Source: Ambulatory Visit | Attending: Internal Medicine | Admitting: Internal Medicine

## 2024-09-10 DIAGNOSIS — Z1231 Encounter for screening mammogram for malignant neoplasm of breast: Secondary | ICD-10-CM | POA: Insufficient documentation
